# Patient Record
Sex: Female | Born: 1965 | Race: White | Hispanic: No | Marital: Married | Smoking: Never smoker
Health system: Southern US, Community
[De-identification: ages and names within clinical notes are randomized; demographics above are authoritative.]

---

## 1997-12-21 ENCOUNTER — Ambulatory Visit (HOSPITAL_COMMUNITY): Admission: RE | Admit: 1997-12-21 | Discharge: 1997-12-21 | Payer: Self-pay | Admitting: Obstetrics

## 1998-05-05 ENCOUNTER — Inpatient Hospital Stay (HOSPITAL_COMMUNITY): Admission: AD | Admit: 1998-05-05 | Discharge: 1998-05-05 | Payer: Self-pay | Admitting: *Deleted

## 1998-05-06 ENCOUNTER — Inpatient Hospital Stay (HOSPITAL_COMMUNITY): Admission: AD | Admit: 1998-05-06 | Discharge: 1998-05-08 | Payer: Self-pay | Admitting: Obstetrics & Gynecology

## 1998-08-04 ENCOUNTER — Inpatient Hospital Stay (HOSPITAL_COMMUNITY): Admission: AD | Admit: 1998-08-04 | Discharge: 1998-08-04 | Payer: Self-pay | Admitting: Obstetrics & Gynecology

## 2010-03-13 ENCOUNTER — Emergency Department (HOSPITAL_COMMUNITY)
Admission: EM | Admit: 2010-03-13 | Discharge: 2010-03-13 | Payer: Self-pay | Source: Home / Self Care | Admitting: Family Medicine

## 2010-03-20 LAB — POCT URINALYSIS DIPSTICK
Bilirubin Urine: NEGATIVE
Ketones, ur: NEGATIVE mg/dL
Nitrite: POSITIVE — AB
Protein, ur: NEGATIVE mg/dL
Specific Gravity, Urine: 1.025 (ref 1.005–1.030)
Urine Glucose, Fasting: NEGATIVE mg/dL
Urobilinogen, UA: 0.2 mg/dL (ref 0.0–1.0)
pH: 5.5 (ref 5.0–8.0)

## 2010-03-20 LAB — POCT PREGNANCY, URINE: Preg Test, Ur: NEGATIVE

## 2010-03-20 LAB — POCT RAPID STREP A (OFFICE): Streptococcus, Group A Screen (Direct): NEGATIVE

## 2010-04-05 ENCOUNTER — Ambulatory Visit: Admit: 2010-04-05 | Payer: Self-pay | Admitting: Obstetrics and Gynecology

## 2010-04-05 ENCOUNTER — Other Ambulatory Visit: Payer: Self-pay | Admitting: Family Medicine

## 2010-04-05 ENCOUNTER — Encounter: Payer: Self-pay | Admitting: Obstetrics and Gynecology

## 2010-04-05 ENCOUNTER — Encounter: Payer: Self-pay | Admitting: Obstetrics & Gynecology

## 2010-04-05 DIAGNOSIS — E049 Nontoxic goiter, unspecified: Secondary | ICD-10-CM

## 2010-04-05 DIAGNOSIS — N938 Other specified abnormal uterine and vaginal bleeding: Secondary | ICD-10-CM

## 2010-04-05 DIAGNOSIS — E010 Iodine-deficiency related diffuse (endemic) goiter: Secondary | ICD-10-CM

## 2010-04-05 DIAGNOSIS — N949 Unspecified condition associated with female genital organs and menstrual cycle: Secondary | ICD-10-CM

## 2010-04-05 LAB — CONVERTED CEMR LAB: TSH: 1.793 microintl units/mL (ref 0.350–4.500)

## 2010-04-06 ENCOUNTER — Ambulatory Visit (HOSPITAL_COMMUNITY)
Admission: RE | Admit: 2010-04-06 | Discharge: 2010-04-06 | Disposition: A | Discharge status: Home / Self Care | Payer: Self-pay | Source: Ambulatory Visit

## 2010-04-06 ENCOUNTER — Other Ambulatory Visit: Payer: Self-pay | Admitting: Obstetrics & Gynecology

## 2010-04-06 DIAGNOSIS — E010 Iodine-deficiency related diffuse (endemic) goiter: Secondary | ICD-10-CM

## 2010-04-06 DIAGNOSIS — N938 Other specified abnormal uterine and vaginal bleeding: Secondary | ICD-10-CM

## 2010-04-06 DIAGNOSIS — E049 Nontoxic goiter, unspecified: Secondary | ICD-10-CM | POA: Insufficient documentation

## 2010-04-06 LAB — POCT PREGNANCY, URINE: Preg Test, Ur: NEGATIVE

## 2010-04-19 ENCOUNTER — Ambulatory Visit: Payer: Self-pay | Admitting: Family Medicine

## 2010-04-19 ENCOUNTER — Other Ambulatory Visit: Payer: Self-pay | Admitting: Family Medicine

## 2010-04-19 DIAGNOSIS — E049 Nontoxic goiter, unspecified: Secondary | ICD-10-CM

## 2010-04-19 DIAGNOSIS — N926 Irregular menstruation, unspecified: Secondary | ICD-10-CM

## 2010-04-19 DIAGNOSIS — N939 Abnormal uterine and vaginal bleeding, unspecified: Secondary | ICD-10-CM

## 2010-04-19 LAB — POCT PREGNANCY, URINE: Preg Test, Ur: NEGATIVE

## 2010-05-11 ENCOUNTER — Ambulatory Visit: Payer: Self-pay | Admitting: Obstetrics & Gynecology

## 2010-05-17 ENCOUNTER — Ambulatory Visit: Payer: Self-pay | Admitting: Obstetrics & Gynecology

## 2010-05-17 DIAGNOSIS — N920 Excessive and frequent menstruation with regular cycle: Secondary | ICD-10-CM

## 2010-05-18 ENCOUNTER — Encounter: Payer: Self-pay | Admitting: *Deleted

## 2010-05-18 LAB — CONVERTED CEMR LAB
Clue Cells Wet Prep HPF POC: NONE SEEN
Trich, Wet Prep: NONE SEEN
Yeast Wet Prep HPF POC: NONE SEEN

## 2010-05-26 NOTE — Progress Notes (Signed)
NAME:  Marie, Watson ACCOUNT NO.:  [id number]  MEDICAL RECORD NO.:  [id number]           PATIENT TYPE:  A  LOCATION:  [facility]                   FACILITY:  [facility]  PHYSICIAN:  Marie Mellow, DO   DATE OF BIRTH:  01-Aug-1965  DATE OF SERVICE:  04/19/2010                                 CLINIC NOTE  CHIEF COMPLAINT:  She needed to get the result of her blood work and ultrasound that were done previously.  She also states that she has some lower abdominal pain on the left which she thinks is related to her ovary.  HISTORY OF PRESENT ILLNESS:  The patient is a 45 year old female gravida 2, para 2, who had presented to the GYN Clinic previously for irregular menstrual bleeding.  She states that her last regular menstrual period was sometime in September 2011, but since has been having periods that go back and forth between 10 and 14 days apart and she will have heavy bleeding and then no bleeding and then heavy bleeding and no bleeding. This has continued since her past visit on April 05, 2010.  She states that most of the time her bleeding is painless and she does not feel weak or dizzy.  She has no idea about her mom or her sister's history of their menopausal history are any difficulties they had.  She does note two of her sisters have a history of thyroid disease for which they take medication. When she was seen back on April 05, 2010, she was noted to have enlargement of her thyroid, so she was scheduled to have an ultrasound of her thyroid and of her uterus for further evaluation.  SURGICAL HISTORY:  History of 1 C-section.  GYNECOLOGICAL HISTORY:  No history of abnormal paps.  No history of STD.  OBSTETRICAL HISTORY:  Gravida 2, para 2, history of 1 C-section and 1 vaginal delivery after C-section.  PHYSICAL EXAMINATION:  VITAL SIGNS:  The patient's temperature is 98.0, pulse 91, blood pressure 155/86, height is 63 inches.  Ultrasound findings of the  uterus showed, endometrium appears trilayered with an 11-mm area of focal echogenicity is seen in the right fundal region, measuring 1.5 x 1.1 x 1.4 cm.  No definite feeding vessels seen at this region, but it appears to be suspicious for a focal polyp.  The left ovary measures 4.2 x 2.7 x 2.3 and contains a unilocular simple cyst measuring 3.3 x 1.1 x 2.5 cm.  Right ovary has normal appearance. Thyroid ultrasound shows markedly inhomogeneous thyroid parenchyma with a dominant 5 x 24 mm hypoechoic nodule on the left, this is where the enlargement was palpated on April 05, 2010.  ASSESSMENT: 1. Dysfunctional uterine bleeding with fundal polyp.  The patient     needs to have D and C and hysteroscopy, however, because of the     thyroid problem, it is recommended that she is seen for her thyroid     problem first to make sure that she is not at risk for thyroid     storm during the operation. 2. 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  She has     consulted [facility] on April 20, 2010, at which time it  will be determined if she needs to have any biopsies or not.  Of     note, her thyroid function was normal at 1.793 and requires no     medications at this time.  However, they may do further studies at     ENT that would require such.  These results were shared with the     patient through the use of an interpreter because she does speak     [place], she is from [place], but she voices understanding.  All of her     and her husband's questions were answered and she is to follow up     in the clinic after she sees ENT to schedule for her D and C and     hysteroscopy.          ______________________________ Marie Mellow, DO    SH/MEDQ  D:  04/21/2010  T:  04/21/2010  Job:  [id number]

## 2010-05-26 NOTE — Progress Notes (Signed)
Marie Watson, Marie Watson ACCOUNT NO.:  [id number]  MEDICAL RECORD NO.:  [id number]           PATIENT TYPE:  A  [facility]ATION[facility]                   [facility]ACILITY[facility]  PHYSICIAN:  Lucina Mellow, DO   DATE OF BIRTH:  [birth year]  DATE OF SERVICE:  04/05/2010                                 CLINIC NOTE  The patient is a 45 year old female, gravida 2, para 2 who presents to the GYN Clinic this afternoon for a chief complaint of irregular menstrual bleeding.  The patient states that her last regular menstrual period was in September 2011, but since then she has been having these periods the time they go back and forth about 10-14 days apart, where she will have heavy bleeding and then no bleeding and then heavy bleeding and then no bleeding.  However, since March 02, 2010, she has no bleeding every day at a rate of about a medium flow, where she is changing her pad about every 3 hours.  The patient states that she has no pain with this bleeding and does not feel weak or dizzy.  She does not know how old her mom was when she went through menopause.  She has 6 sisters, but she does not know when any of them went through menopause. Two of her sisters have a history of thyroid disease.  Her Pap was last done in July 2011.  She has never had an abnormal Pap test and denies a history of any sexually transmitted diseases.  SURGICAL HISTORY:  History of one cesarean section.  GYNECOLOGIC HISTORY:  As noted, no history of abnormal Paps, no history of STD.  OBSTETRICAL HISTORY:  She is a gravida 2, para 2-0-0-2, history of one C- section and one successful vaginal delivery after cesarean section.  MEDICAL HISTORY:  None.  PHYSICAL EXAMINATION:  Today, the patient is with a blood pressure of 133/85, temperature of 97.0, pulse of 93, weight of 226.8, height of 63 inches.  She in general is a pleasant-appearing female who looks younger than her stated age of 43.  Her nationality  is [place] and she speaks [place].  Her heart is regular rate and rhythm with no audible murmurs or gallops.  Her lungs are clear to auscultation bilaterally.  Her thyroid feels enlarged on the left side without any obvious nodule.  Her external female genitalia are normal in appearance without any scarring or abnormalities.  Internally, her vagina is pink with appropriate number of rugae.  Her cervix is visualized with moderate amount of bleeding noted coming from the cervical os with a moderate amount of bright red blood in the vaginal vault without any clots.  Bimanual exam reveals about a 15-week size uterus, normal feeling adnexa without any pain or cervical motion tenderness.  ASSESSMENT: 1. Dysfunctional uterine bleeding.  The patient will have an     ultrasound scheduled to evaluate her uterus and ovaries.  We will     check her thyroid to make sure this is not a hormonal reason for     her bleeding dysfunction.  A urine pregnancy test also should be     obtained as well as a CBC in the clinic.  She will come back in  approximately a week to two weeks after her ultrasound and discuss     the results and plans. 2. Thyroid enlargement.  We will get a thyroid ultrasound and a TSH to     further evaluate this and treat as appropriate.  I was discussed     with the patient briefly that birth control by mouth maybe one of     the options to control the bleeding.  However, this is not started     today without reason behind it, the fact that she is having the     bleeding in addition to the fact that she is 44 years old.  We will     discuss treatment options which she follows up after her testing at     which time an endometrial biopsy may be obtained.  The patient     voices understanding and agrees with the plan.          ______________________________ Lucina Mellow, DO    SH/MEDQ  D:  04/05/2010  T:  04/06/2010  Job:  [id number]

## 2010-05-26 NOTE — H&P (Signed)
NAME:  Marie Watson, Marie Watson NO.:  [id number]  MEDICAL RECORD NO.:  [id number]           PATIENT TYPE:  A  [facility]ATION[facility]                   [facility]  PHYSICIAN:  Scheryl Darter, MD       DATE OF BIRTH:  01-24-1966  DATE OF SERVICE:                          PRE-OP HISTORY & PHYSICAL  CHIEF COMPLAINT:  Heavy irregular menstrual bleeding.  The patient is a 45 year old female from [place] gravida 2, para 2 who came to GYN Clinic due to irregular menstrual bleeding.  She had regular periods until September and since then, her periods that have been between 10 and 14 days apart and showed a heavy bleeding, then no bleeding, then heavy bleeding, then no bleeding.  Last menstrual period was April 21, 2010.  She does feel weak and dizzy, apnea, dysmenorrhea.  PAST MEDICAL HISTORY:  Thyroid enlargement with normal thyroid function.  PAST SURGICAL HISTORY:  Cesarean section.  GYNECOLOGIC HISTORY:  History of abnormal Pap.  SOCIAL HISTORY:  The patient is married.  She is from [place].  She requires an interpreter.  She speaks some [place].  No alcohol, tobacco, or drug use.  MEDICATIONS:  None.  No known drug allergies.  No latex allergy.  FAMILY HISTORY:  Diabetes and hypertension in her parents.  REVIEW OF SYSTEMS:  No bleeding today.  She notes a discharge with no odor or itching.  No pain.  No dysuria.  PHYSICAL EXAMINATION:  GENERAL:  In no acute distress.  The patient's affect appears normal. VITAL SIGNS:  Weight is 229 pounds, height 63 inches, blood pressure 118/77, pulse 87, temperature 98.4. ABDOMEN:  Soft, nontender.  No mass. EXTERNAL GENITALIA:  Vagina, cervix showed slight thin mucus discharge. Cervix appears normal.  No blood.  Uterus is normal size, nontender.  No mass.  Ultrasound done on April 06, 2010, showed uterus measuring 9.7 x 4.8 x 6 cm with mildly heterogeneous myometrium.  Endometrium was 11 mm with a 1.5 x 1.1 x 1.4 cm  fundal focal echogenicity suspicious for focal polyp. Ovaries appeared normal.  IMPRESSION:  Dysfunctional uterine bleeding and probable endometrial polyp.  PLAN:  She will be scheduled for hysteroscopy, dilation and curettage. We will perform polypectomy if indicated.  Procedure was explained, and the risks of anesthesia, bleeding, infection, uterine damage. Interpreter was present for the discussion as was her husband. Questions were answered.  This will be scheduled as an outpatient.     Scheryl Darter, MD    JA/MEDQ  D:  05/17/2010  T:  05/17/2010  Job:  [id number]

## 2010-05-29 ENCOUNTER — Other Ambulatory Visit: Payer: Self-pay | Admitting: Obstetrics & Gynecology

## 2010-05-29 ENCOUNTER — Ambulatory Visit (HOSPITAL_COMMUNITY)
Admission: RE | Admit: 2010-05-29 | Discharge: 2010-05-29 | Disposition: A | Discharge status: Home / Self Care | Payer: Self-pay | Source: Ambulatory Visit | Attending: Obstetrics & Gynecology | Admitting: Obstetrics & Gynecology

## 2010-05-29 DIAGNOSIS — N840 Polyp of corpus uteri: Secondary | ICD-10-CM

## 2010-05-29 DIAGNOSIS — N949 Unspecified condition associated with female genital organs and menstrual cycle: Secondary | ICD-10-CM | POA: Insufficient documentation

## 2010-05-29 DIAGNOSIS — N938 Other specified abnormal uterine and vaginal bleeding: Secondary | ICD-10-CM | POA: Insufficient documentation

## 2010-05-29 LAB — CBC
HCT: 29.4 % — ABNORMAL LOW (ref 36.0–46.0)
Hemoglobin: 8.8 g/dL — ABNORMAL LOW (ref 12.0–15.0)
MCH: 21.3 pg — ABNORMAL LOW (ref 26.0–34.0)
MCHC: 29.9 g/dL — ABNORMAL LOW (ref 30.0–36.0)
MCV: 71 fL — ABNORMAL LOW (ref 78.0–100.0)
Platelets: 361 10*3/uL (ref 150–400)
RBC: 4.14 MIL/uL (ref 3.87–5.11)
RDW: 15.5 % (ref 11.5–15.5)
WBC: 8 10*3/uL (ref 4.0–10.5)

## 2010-05-29 LAB — PREGNANCY, URINE: Preg Test, Ur: NEGATIVE

## 2010-06-05 NOTE — Op Note (Signed)
  NAME:  Marie Watson, Marie Watson NO.:  [id number]  MEDICAL RECORD NO.:  [id number]           PATIENT TYPE:  O  LOCATION:  [facility]                          [facility]  PHYSICIAN:  Scheryl Darter, MD       DATE OF BIRTH:  Feb 18, 1966  DATE OF PROCEDURE:  05/29/2010 DATE OF DISCHARGE:                              OPERATIVE REPORT   PREOPERATIVE DIAGNOSIS:  Dysfunctional uterine bleeding and endometrial polyp.  POSTOPERATIVE DIAGNOSIS:  Dysfunctional uterine bleeding.  PROCEDURE:  Hysteroscopy and endometrial curettage.  SURGEON:  Scheryl Darter, MD  ANESTHESIA:  General plus local.  SPECIMENS:  Endometrial curettings.  ESTIMATED BLOOD LOSS:  Minimal.  COMPLICATIONS:  None.  DRAINS:  None.  COUNTS:  Correct.  OPERATIVE COURSE:  The patient gave written consent for hysteroscopy, dilation and curettage, and possible endometrial polypectomy.  She had been experiencing dysfunctional uterine bleeding and ultrasound showed a possible 1.5-cm endometrial polyp.  The patient identification was confirmed.  She was brought to the OR and general anesthesia was induced.  She was placed in dorsal lithotomy position.  Perineum and vagina were sterilely prepped and draped.  Exam revealed normal-size uterus.  No adnexal masses.  Bladder was drained with a red rubber catheter.  Speculum was inserted and cervix was grasped with single- tooth tenaculum.  A 0.5% Marcaine 1:200,000 epinephrine was infiltrated. Uterus sounded to 10.5 cm.  Cervix was dilated sufficiently to pass the diagnostic hysteroscope with lactated Ringer's used.  Video camera was induced.  Panoramic view of the endometrial cavity was seen.  Both tubal ostia were seen.  There were no discrete lesions as no discrete polyp was visualized proceeded with curettage.  The hysteroscope was removed. Curette was used to obtain specimens from all quadrants and fair amount of tissue was obtained and appeared in some portions  to be polypoid.  Specimen was sent to Pathology.  There was minimal bleeding. All instruments were then removed.  The patient tolerated the procedure well without complication.  She was brought in stable condition to the recovery room.     Scheryl Darter, MD     JA/MEDQ  D:  05/29/2010  T:  05/30/2010  Job:  [id number]  Electronically Signed by Scheryl Darter MD on 06/05/2010 11:36:21 AM

## 2010-06-21 ENCOUNTER — Ambulatory Visit: Payer: Self-pay | Admitting: Obstetrics & Gynecology

## 2010-06-21 DIAGNOSIS — N840 Polyp of corpus uteri: Secondary | ICD-10-CM

## 2010-06-21 DIAGNOSIS — Z09 Encounter for follow-up examination after completed treatment for conditions other than malignant neoplasm: Secondary | ICD-10-CM

## 2010-06-22 NOTE — Group Therapy Note (Signed)
NAME:  Marie Watson, Marie Watson NO.:  [id number]  MEDICAL RECORD NO.:  [id number]           PATIENT TYPE:  A  [facility]                   [facility]  PHYSICIAN:  Scheryl Darter, MD       DATE OF BIRTH:  Jun 16, 1965  DATE OF SERVICE:  06/21/2010                                 CLINIC NOTE  The patient returns today after hysteroscopy and D and C performed on May 29, 2010.  The patient had dysfunctional uterine bleeding and endometrial polyp on ultrasound.  Hysteroscopy showed some polyps. Endometrial curettings were done.  This showed proliferative endometrium and fragments of benign endometrial polyp.  The patient has no bleeding now.  She bled for about 10 days after surgery.  No complaints of pain, fever, or abnormal discharge.  Abdomen is soft and nontender.  IMPRESSION:  The patient is doing well after surgery.  She should keep a menstrual calendar.  She should notify if she has problems in the meantime.     Scheryl Darter, MD    JA/MEDQ  D:  06/21/2010  T:  06/22/2010  Job:  [id number]

## 2010-06-30 ENCOUNTER — Inpatient Hospital Stay (HOSPITAL_COMMUNITY)
Admission: AD | Admit: 2010-06-30 | Discharge: 2010-06-30 | Disposition: A | Discharge status: Home / Self Care | Payer: Self-pay | Source: Ambulatory Visit | Attending: Family Medicine | Admitting: Family Medicine

## 2010-06-30 DIAGNOSIS — N949 Unspecified condition associated with female genital organs and menstrual cycle: Secondary | ICD-10-CM | POA: Insufficient documentation

## 2010-06-30 DIAGNOSIS — N760 Acute vaginitis: Secondary | ICD-10-CM | POA: Insufficient documentation

## 2010-06-30 DIAGNOSIS — B9689 Other specified bacterial agents as the cause of diseases classified elsewhere: Secondary | ICD-10-CM | POA: Insufficient documentation

## 2010-06-30 DIAGNOSIS — A499 Bacterial infection, unspecified: Secondary | ICD-10-CM | POA: Insufficient documentation

## 2010-06-30 LAB — URINALYSIS, ROUTINE W REFLEX MICROSCOPIC
Bilirubin Urine: NEGATIVE
Glucose, UA: NEGATIVE mg/dL
Hgb urine dipstick: NEGATIVE
Ketones, ur: NEGATIVE mg/dL
Nitrite: NEGATIVE
Protein, ur: NEGATIVE mg/dL
Specific Gravity, Urine: >1.03 — ABNORMAL HIGH (ref 1.005–1.030)
Urobilinogen, UA: 0.2 mg/dL (ref 0.0–1.0)
pH: 5.5 (ref 5.0–8.0)

## 2010-06-30 LAB — WET PREP, GENITAL
Clue Cells Wet Prep HPF POC: NONE SEEN
Trich, Wet Prep: NONE SEEN
Yeast Wet Prep HPF POC: NONE SEEN

## 2010-07-28 ENCOUNTER — Inpatient Hospital Stay (HOSPITAL_COMMUNITY)
Admission: AD | Admit: 2010-07-28 | Discharge: 2010-07-28 | Disposition: A | Discharge status: Home / Self Care | Payer: Self-pay | Source: Ambulatory Visit | Attending: Obstetrics & Gynecology | Admitting: Obstetrics & Gynecology

## 2010-07-28 DIAGNOSIS — N920 Excessive and frequent menstruation with regular cycle: Secondary | ICD-10-CM

## 2010-07-28 LAB — CBC
HCT: 34.7 % — ABNORMAL LOW (ref 36.0–46.0)
Hemoglobin: 11 g/dL — ABNORMAL LOW (ref 12.0–15.0)
MCH: 24.1 pg — ABNORMAL LOW (ref 26.0–34.0)
MCHC: 31.7 g/dL (ref 30.0–36.0)
MCV: 75.9 fL — ABNORMAL LOW (ref 78.0–100.0)
Platelets: 342 10*3/uL (ref 150–400)
RBC: 4.57 MIL/uL (ref 3.87–5.11)
RDW: 21.4 % — ABNORMAL HIGH (ref 11.5–15.5)
WBC: 6.7 10*3/uL (ref 4.0–10.5)

## 2010-07-28 LAB — POCT PREGNANCY, URINE: Preg Test, Ur: NEGATIVE

## 2010-09-01 ENCOUNTER — Ambulatory Visit: Payer: Self-pay | Admitting: Obstetrics & Gynecology

## 2010-09-01 DIAGNOSIS — N920 Excessive and frequent menstruation with regular cycle: Secondary | ICD-10-CM

## 2010-09-02 NOTE — Group Therapy Note (Signed)
NAME:  Marie Watson, Marie Watson NO.:  [id number]  MEDICAL RECORD NO.:  [id number]           PATIENT TYPE:  A  [facility]                   [facility]  PHYSICIAN:  Scheryl Darter, MD       DATE OF BIRTH:  10-29-1965  DATE OF SERVICE:  09/01/2010                                 CLINIC NOTE  The patient returns today due to recent episode of prolonged heavy menstrual bleeding.  The patient is a 45 year old, gravida 2, para 2 who has a history of irregular menstrual periods.  She had endometrial polyps and a hysteroscopy and D and C performed May 29, 2010, which showed polyps and showed proliferative endometrium and endometrial polyp.  She started a menstrual period at mid May and continued until mid June.  She had one visit to [facility] on Jul 28, 2010, due to heavy bleeding.  At that time, her hemoglobin was 11.1.  She was given Provera 10 mg tablets for 7 days.  She currently has no medications.  No drug allergies.  REVIEW OF SYSTEMS:  No bleeding or pain.  PHYSICAL EXAMINATION:  She is not pale.  Her abdomen is nontender.  I deferred a pelvic exam today.  We discussed her dysfunctional uterine bleeding.  I offered hormonal therapy with oral contraceptives or Mirena.  She would like to try oral contraceptives.  She has taken these in the past.  She has no contraindications to their use.  I gave her prescription for Tri-Sprintec.  She should start this as soon as possible.  She can return in 3 months to review her progress.     Scheryl Darter, MD    JA/MEDQ  D:  09/01/2010  T:  09/02/2010  Job:  [id number]

## 2010-12-25 ENCOUNTER — Other Ambulatory Visit: Payer: Self-pay | Admitting: Otolaryngology

## 2011-01-12 ENCOUNTER — Ambulatory Visit: Payer: Self-pay | Admitting: Obstetrics & Gynecology

## 2013-05-05 ENCOUNTER — Other Ambulatory Visit: Payer: Self-pay | Admitting: *Deleted

## 2013-05-05 DIAGNOSIS — I83893 Varicose veins of bilateral lower extremities with other complications: Secondary | ICD-10-CM

## 2013-06-10 ENCOUNTER — Other Ambulatory Visit: Payer: Self-pay | Admitting: Obstetrics and Gynecology

## 2013-06-10 ENCOUNTER — Other Ambulatory Visit (HOSPITAL_COMMUNITY)
Admission: RE | Admit: 2013-06-10 | Discharge: 2013-06-10 | Disposition: A | Discharge status: Home / Self Care | Payer: BC Managed Care – PPO | Source: Ambulatory Visit | Attending: Obstetrics and Gynecology | Admitting: Obstetrics and Gynecology

## 2013-06-10 DIAGNOSIS — Z1151 Encounter for screening for human papillomavirus (HPV): Secondary | ICD-10-CM | POA: Insufficient documentation

## 2013-06-10 DIAGNOSIS — Z01419 Encounter for gynecological examination (general) (routine) without abnormal findings: Secondary | ICD-10-CM | POA: Insufficient documentation

## 2013-06-17 ENCOUNTER — Encounter: Payer: Self-pay | Admitting: Vascular Surgery

## 2013-06-18 ENCOUNTER — Encounter: Payer: Self-pay | Admitting: Vascular Surgery

## 2013-06-18 ENCOUNTER — Ambulatory Visit (INDEPENDENT_AMBULATORY_CARE_PROVIDER_SITE_OTHER): Payer: BC Managed Care – PPO | Admitting: Vascular Surgery

## 2013-06-18 ENCOUNTER — Ambulatory Visit (HOSPITAL_COMMUNITY)
Admission: RE | Admit: 2013-06-18 | Discharge: 2013-06-18 | Disposition: A | Discharge status: Home / Self Care | Payer: BC Managed Care – PPO | Source: Ambulatory Visit | Attending: Vascular Surgery | Admitting: Vascular Surgery

## 2013-06-18 VITALS — BP 121/71 | HR 92 | Ht 66.0 in | Wt 224.0 lb

## 2013-06-18 DIAGNOSIS — I83893 Varicose veins of bilateral lower extremities with other complications: Secondary | ICD-10-CM

## 2013-06-18 DIAGNOSIS — I8390 Asymptomatic varicose veins of unspecified lower extremity: Secondary | ICD-10-CM | POA: Insufficient documentation

## 2013-06-18 NOTE — Progress Notes (Signed)
[facility] [place] HISTORY AND PHYSICAL   History of Present Illness:  Patient is a 48 y.o. year old female who presents for evaluation of varicose veins. The patient has developed scattered spider-type veins on her anterior thigh and medial calf. They have been present for several months. He did get slightly worse after having 2 childbirth sessions. She denies prior history of DVT. She does have a family history of varicose veins in her mother and father. She has occasional leg swelling.   No past medical history on file.  No past surgical history on file.  Social History History  Substance Use Topics  . Smoking status: Never Smoker   . Smokeless tobacco: Never Used  . Alcohol Use: No    Family History Family History  Problem Relation Age of Onset  . Diabetes Mother   . Varicose Veins Father   . Diabetes Brother     Allergies  No Known Allergies   No current outpatient prescriptions on file.   No current facility-administered medications for this visit.    ROS:   General:  No weight loss, Fever, chills  HEENT: No recent headaches, no nasal bleeding, no visual changes, no sore throat  Neurologic: No dizziness, blackouts, seizures. No recent symptoms of stroke or mini- stroke. No recent episodes of slurred speech, or temporary blindness.  Cardiac: No recent episodes of chest pain/pressure, no shortness of breath at rest.  No shortness of breath with exertion.  Denies history of atrial fibrillation or irregular heartbeat  Vascular: No history of rest pain in feet.  No history of claudication.  No history of non-healing ulcer, No history of DVT   Pulmonary: No home oxygen, no productive cough, no hemoptysis,  No asthma or wheezing  Musculoskeletal:  [ ]  Arthritis, [ ]  Low back pain,  [ ]  Joint pain  Hematologic:No history of hypercoagulable state.  No history of easy bleeding.  No history of anemia  Gastrointestinal: No hematochezia or melena,   No gastroesophageal reflux, no trouble swallowing  Urinary: [ ]  chronic Kidney disease, [ ]  on HD - [ ]  MWF or [ ]  TTHS, [ ]  Burning with urination, [ ]  Frequent urination, [ ]  Difficulty urinating;   Skin: No rashes  Psychological: No history of anxiety,  No history of depression   Physical Examination  Filed Vitals:   06/18/13 1338  BP: 121/71  Pulse: 92  Height: 5\' 6"  (1.676 m)  Weight: 224 lb (101.606 kg)  SpO2: 100%    Body mass index is 36.17 kg/(m^2).  General:  Alert and oriented, no acute distress HEENT: Normal Neck: No bruit or JVD Pulmonary: Clear to auscultation bilaterally Cardiac: Regular Rate and Rhythm without murmur Abdomen: Soft, non-tender, non-distended, no mass, obese Skin: No rash, scattered spider varicosities right lateral thigh medial knee and thigh left leg Extremity Pulses:  2+ radial, brachial, femoral, dorsalis pedis, posterior tibial pulses bilaterally Musculoskeletal: No deformity or edema  Neurologic: Upper and lower extremity motor 5/5 and symmetric  DATA: Patient had a venous duplex exam today. 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 She was also advised and weight loss. She will set up an appointment in the near future with her vein nurse for sclerotherapy injection.  Fabienne Brunsharles Alyze Lauf, MD [facility] [place] Office: [phone] Pager: [phone]

## 2014-04-02 ENCOUNTER — Other Ambulatory Visit: Payer: Self-pay | Admitting: Family Medicine

## 2014-04-02 ENCOUNTER — Ambulatory Visit
Admission: RE | Admit: 2014-04-02 | Discharge: 2014-04-02 | Disposition: A | Discharge status: Home / Self Care | Payer: BLUE CROSS/BLUE SHIELD | Source: Ambulatory Visit | Attending: Family Medicine | Admitting: Family Medicine

## 2014-04-02 DIAGNOSIS — E010 Iodine-deficiency related diffuse (endemic) goiter: Secondary | ICD-10-CM

## 2014-04-02 DIAGNOSIS — M542 Cervicalgia: Secondary | ICD-10-CM

## 2014-04-06 ENCOUNTER — Other Ambulatory Visit: Payer: Self-pay

## 2014-04-08 ENCOUNTER — Ambulatory Visit
Admission: RE | Admit: 2014-04-08 | Discharge: 2014-04-08 | Disposition: A | Discharge status: Home / Self Care | Payer: BLUE CROSS/BLUE SHIELD | Source: Ambulatory Visit | Attending: Family Medicine | Admitting: Family Medicine

## 2014-04-08 DIAGNOSIS — E010 Iodine-deficiency related diffuse (endemic) goiter: Secondary | ICD-10-CM

## 2014-06-17 ENCOUNTER — Other Ambulatory Visit: Payer: Self-pay | Admitting: Obstetrics and Gynecology

## 2014-06-17 DIAGNOSIS — Z1231 Encounter for screening mammogram for malignant neoplasm of breast: Secondary | ICD-10-CM

## 2014-06-22 ENCOUNTER — Ambulatory Visit
Admission: RE | Admit: 2014-06-22 | Discharge: 2014-06-22 | Disposition: A | Discharge status: Home / Self Care | Payer: BLUE CROSS/BLUE SHIELD | Source: Ambulatory Visit | Attending: Obstetrics and Gynecology | Admitting: Obstetrics and Gynecology

## 2014-06-22 DIAGNOSIS — Z1231 Encounter for screening mammogram for malignant neoplasm of breast: Secondary | ICD-10-CM

## 2016-12-25 ENCOUNTER — Other Ambulatory Visit: Payer: Self-pay | Admitting: Obstetrics and Gynecology

## 2016-12-25 DIAGNOSIS — Z1231 Encounter for screening mammogram for malignant neoplasm of breast: Secondary | ICD-10-CM

## 2016-12-27 ENCOUNTER — Ambulatory Visit
Admission: RE | Admit: 2016-12-27 | Discharge: 2016-12-27 | Disposition: A | Discharge status: Home / Self Care | Payer: BLUE CROSS/BLUE SHIELD | Source: Ambulatory Visit | Attending: Obstetrics and Gynecology | Admitting: Obstetrics and Gynecology

## 2016-12-27 DIAGNOSIS — Z1231 Encounter for screening mammogram for malignant neoplasm of breast: Secondary | ICD-10-CM

## 2017-12-09 ENCOUNTER — Other Ambulatory Visit: Payer: Self-pay | Admitting: Obstetrics and Gynecology

## 2017-12-09 DIAGNOSIS — Z1231 Encounter for screening mammogram for malignant neoplasm of breast: Secondary | ICD-10-CM

## 2018-01-07 ENCOUNTER — Ambulatory Visit
Admission: RE | Admit: 2018-01-07 | Discharge: 2018-01-07 | Disposition: A | Discharge status: Home / Self Care | Payer: BLUE CROSS/BLUE SHIELD | Source: Ambulatory Visit | Attending: Obstetrics and Gynecology | Admitting: Obstetrics and Gynecology

## 2018-01-07 DIAGNOSIS — Z1231 Encounter for screening mammogram for malignant neoplasm of breast: Secondary | ICD-10-CM

## 2018-01-09 ENCOUNTER — Other Ambulatory Visit: Payer: Self-pay | Admitting: Obstetrics and Gynecology

## 2018-01-09 DIAGNOSIS — R928 Other abnormal and inconclusive findings on diagnostic imaging of breast: Secondary | ICD-10-CM

## 2018-03-11 ENCOUNTER — Other Ambulatory Visit: Payer: Self-pay | Admitting: Obstetrics and Gynecology

## 2018-03-11 ENCOUNTER — Other Ambulatory Visit (HOSPITAL_COMMUNITY)
Admission: RE | Admit: 2018-03-11 | Discharge: 2018-03-11 | Disposition: A | Discharge status: Home / Self Care | Payer: BLUE CROSS/BLUE SHIELD | Source: Ambulatory Visit | Attending: Obstetrics and Gynecology | Admitting: Obstetrics and Gynecology

## 2018-03-11 DIAGNOSIS — Z01411 Encounter for gynecological examination (general) (routine) with abnormal findings: Secondary | ICD-10-CM | POA: Diagnosis present

## 2018-03-13 ENCOUNTER — Other Ambulatory Visit: Payer: Self-pay | Admitting: Obstetrics and Gynecology

## 2018-03-13 ENCOUNTER — Ambulatory Visit
Admission: RE | Admit: 2018-03-13 | Discharge: 2018-03-13 | Disposition: A | Discharge status: Home / Self Care | Payer: PRIVATE HEALTH INSURANCE | Source: Ambulatory Visit | Attending: Obstetrics and Gynecology | Admitting: Obstetrics and Gynecology

## 2018-03-13 ENCOUNTER — Ambulatory Visit
Admission: RE | Admit: 2018-03-13 | Discharge: 2018-03-13 | Disposition: A | Discharge status: Home / Self Care | Payer: BLUE CROSS/BLUE SHIELD | Source: Ambulatory Visit | Attending: Obstetrics and Gynecology | Admitting: Obstetrics and Gynecology

## 2018-03-13 DIAGNOSIS — N6310 Unspecified lump in the right breast, unspecified quadrant: Secondary | ICD-10-CM

## 2018-03-13 DIAGNOSIS — R928 Other abnormal and inconclusive findings on diagnostic imaging of breast: Secondary | ICD-10-CM

## 2018-03-13 LAB — CYTOLOGY - PAP
DIAGNOSIS: NEGATIVE
HPV (WINDOPATH): NOT DETECTED

## 2018-03-17 ENCOUNTER — Ambulatory Visit
Admission: RE | Admit: 2018-03-17 | Discharge: 2018-03-17 | Disposition: A | Discharge status: Home / Self Care | Payer: BLUE CROSS/BLUE SHIELD | Source: Ambulatory Visit | Attending: Obstetrics and Gynecology | Admitting: Obstetrics and Gynecology

## 2018-03-17 DIAGNOSIS — N6310 Unspecified lump in the right breast, unspecified quadrant: Secondary | ICD-10-CM

## 2020-08-07 IMAGING — US ULTRASOUND RIGHT BREAST LIMITED
1 series · 10 of 10 positions shown · non-contrast
Comparison: Previous exam(s).

CLINICAL DATA: Screening recall for right breast mass. Strong
family history of breast cancer, with the patient's sister diagnosed
with breast cancer at approximately age 27.

EXAM:
DIGITAL DIAGNOSTIC UNILATERAL RIGHT MAMMOGRAM WITH CAD AND TOMO
RIGHT BREAST ULTRASOUND

[Series 1: ultrasound right breast limited · 0.05mm/px · 10 of 10 slices shown]
[im 1/10]
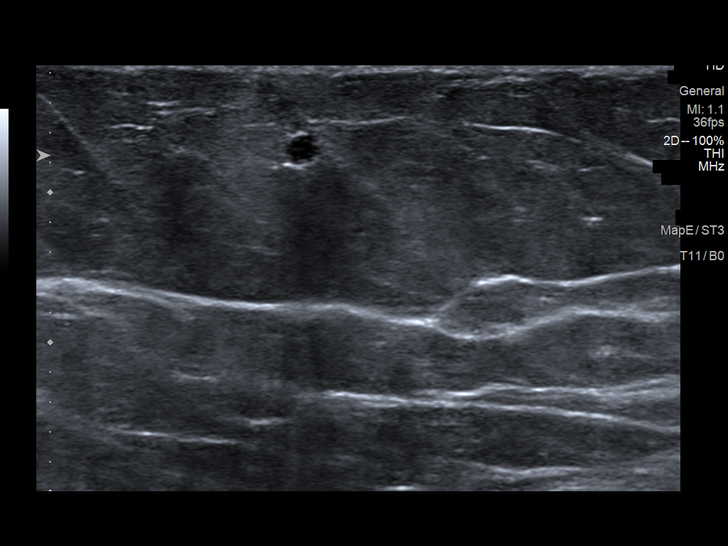
[im 2/10]
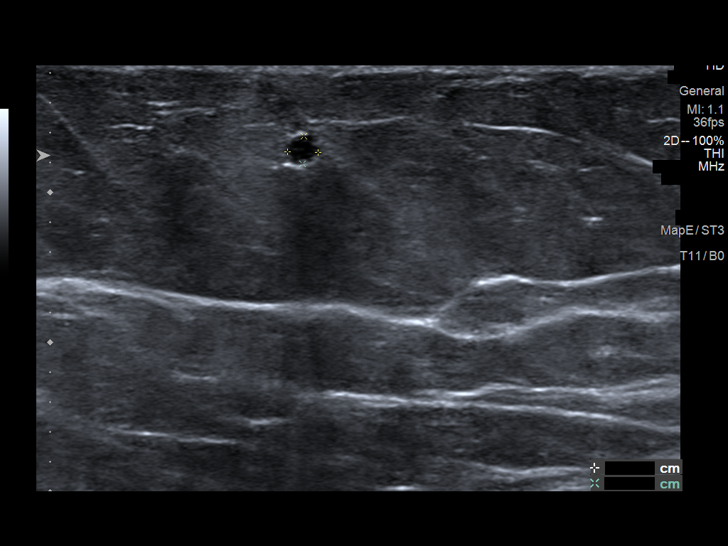
[im 3/10]
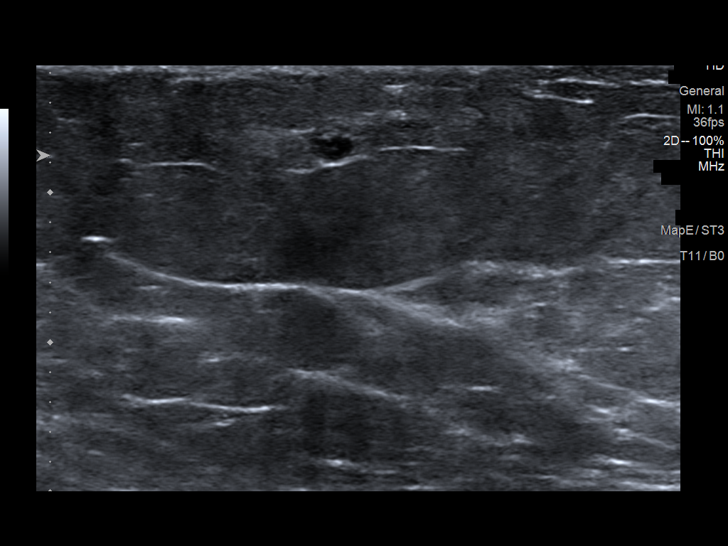
[im 4/10]
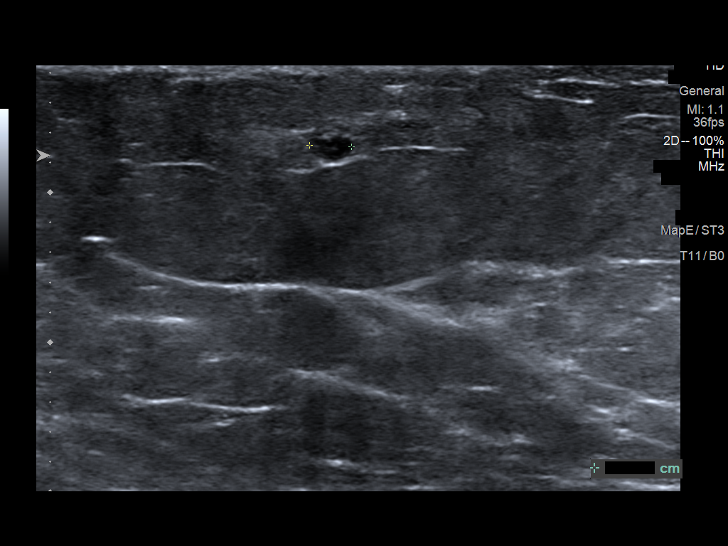
[im 5/10]
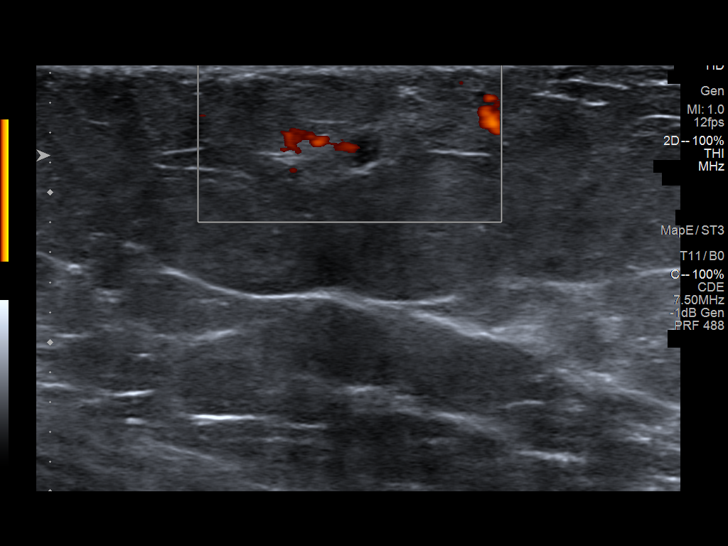
[im 6/10]
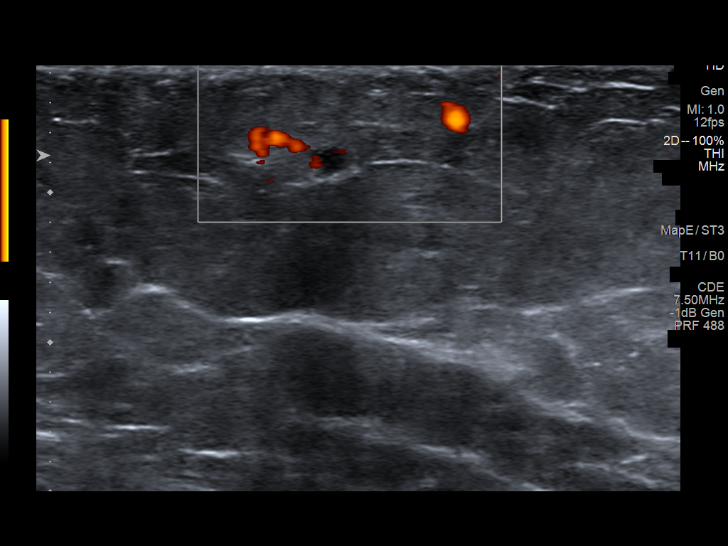
[im 7/10]
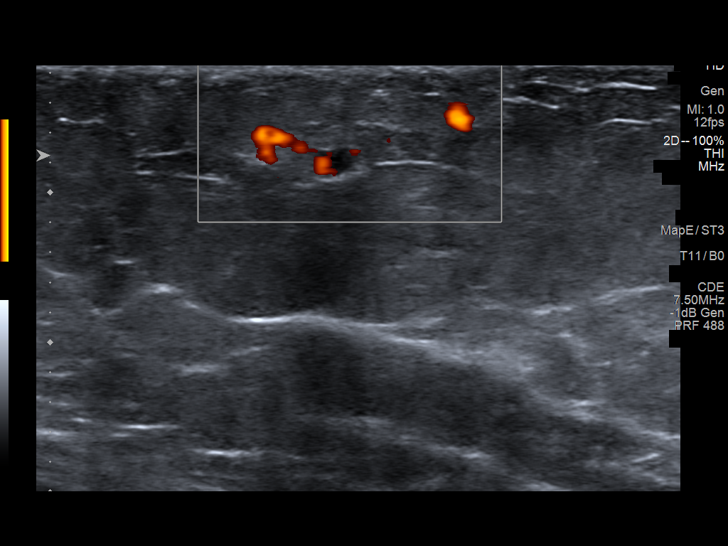
[im 8/10]
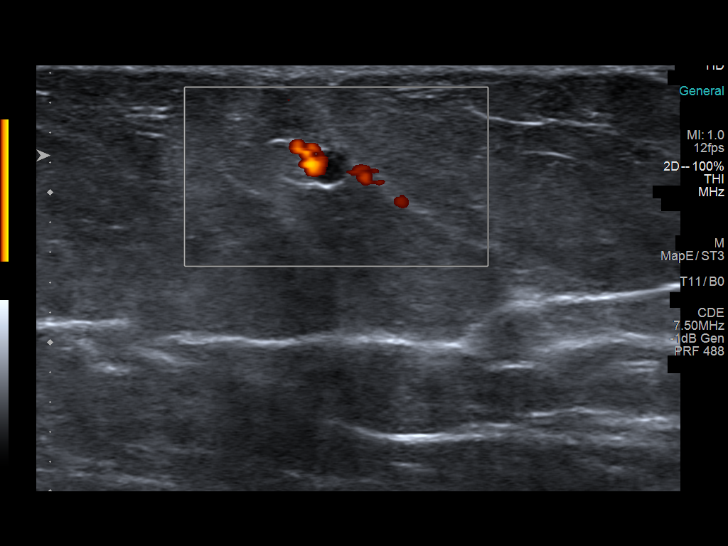
[im 9/10]
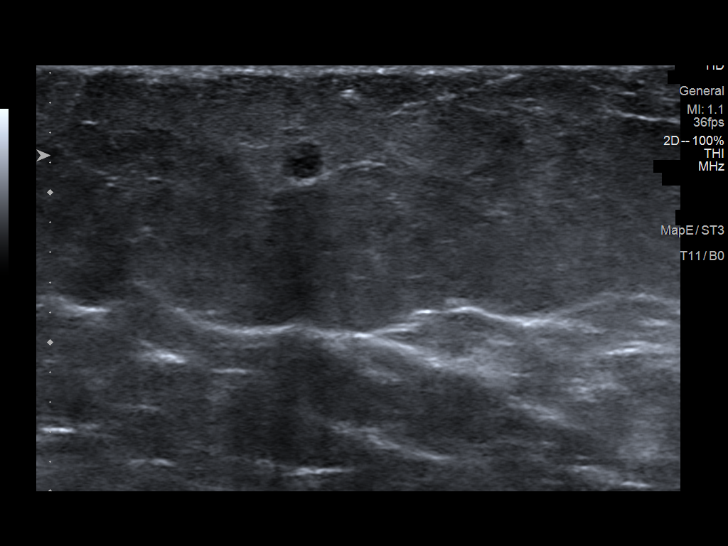
[im 10/10]
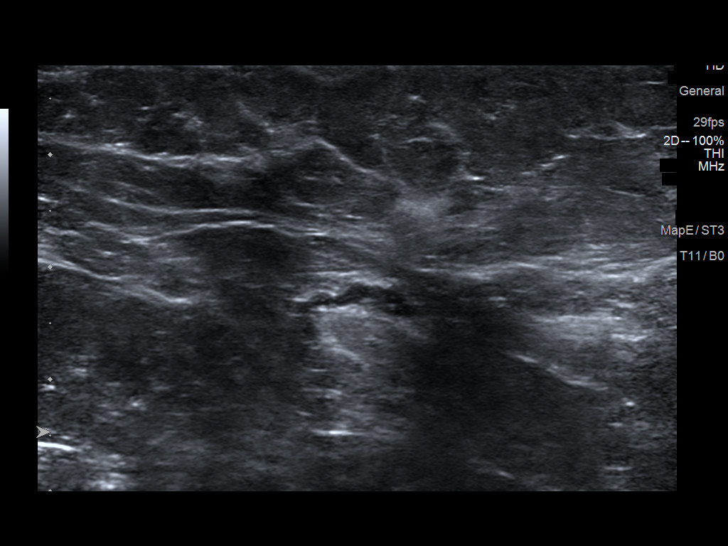

[10 of 10 positions shown; findings below may reference images not displayed]

ACR Breast Density Category b: There are scattered areas of
fibroglandular density.
FINDINGS: Cc and MLO tomograms were performed of the right breast. There is a
small oval well-circumscribed mass in the far superior central right
breast measuring approximately 0.3 cm.

Mammographic images were processed with CAD.

Targeted ultrasound of the right breast was performed. There is an
oval circumscribed hypoechoic mass in the right breast at the [DATE]
position 13 cm from nipple measuring 0.3 x 0.2 x 0.2 cm. This
corresponds well with the mass seen in the right breast at
mammography. This may represent a small reactive lymph node, however
could not be clearly characterized as such.
IMPRESSION: Indeterminate right breast mass.

RECOMMENDATION:
Ultrasound-guided biopsy of the small mass in the right breast is
recommended. This will be scheduled for the patient. The findings
and recommendations have been discussed with both the patient and
her husband.

I have discussed the findings and recommendations with the patient.
Results were also provided in writing at the conclusion of the
visit. If applicable, a reminder letter will be sent to the patient
regarding the next appointment.

BI-RADS CATEGORY  4: Suspicious.

## 2020-08-11 IMAGING — MG MM CLIP PLACEMENT
3 series · 3 of 3 positions shown · non-contrast
Comparison: Previous exam(s).

CLINICAL DATA: Confirmation of clip placement ultrasound-guided
core needle biopsy of a 3 mm mass in the UPPER INNER QUADRANT of the
RIGHT breast at POSTERIOR depth.

EXAM:
DIAGNOSTIC RIGHT MAMMOGRAM POST ULTRASOUND BIOPSY

[R CC]
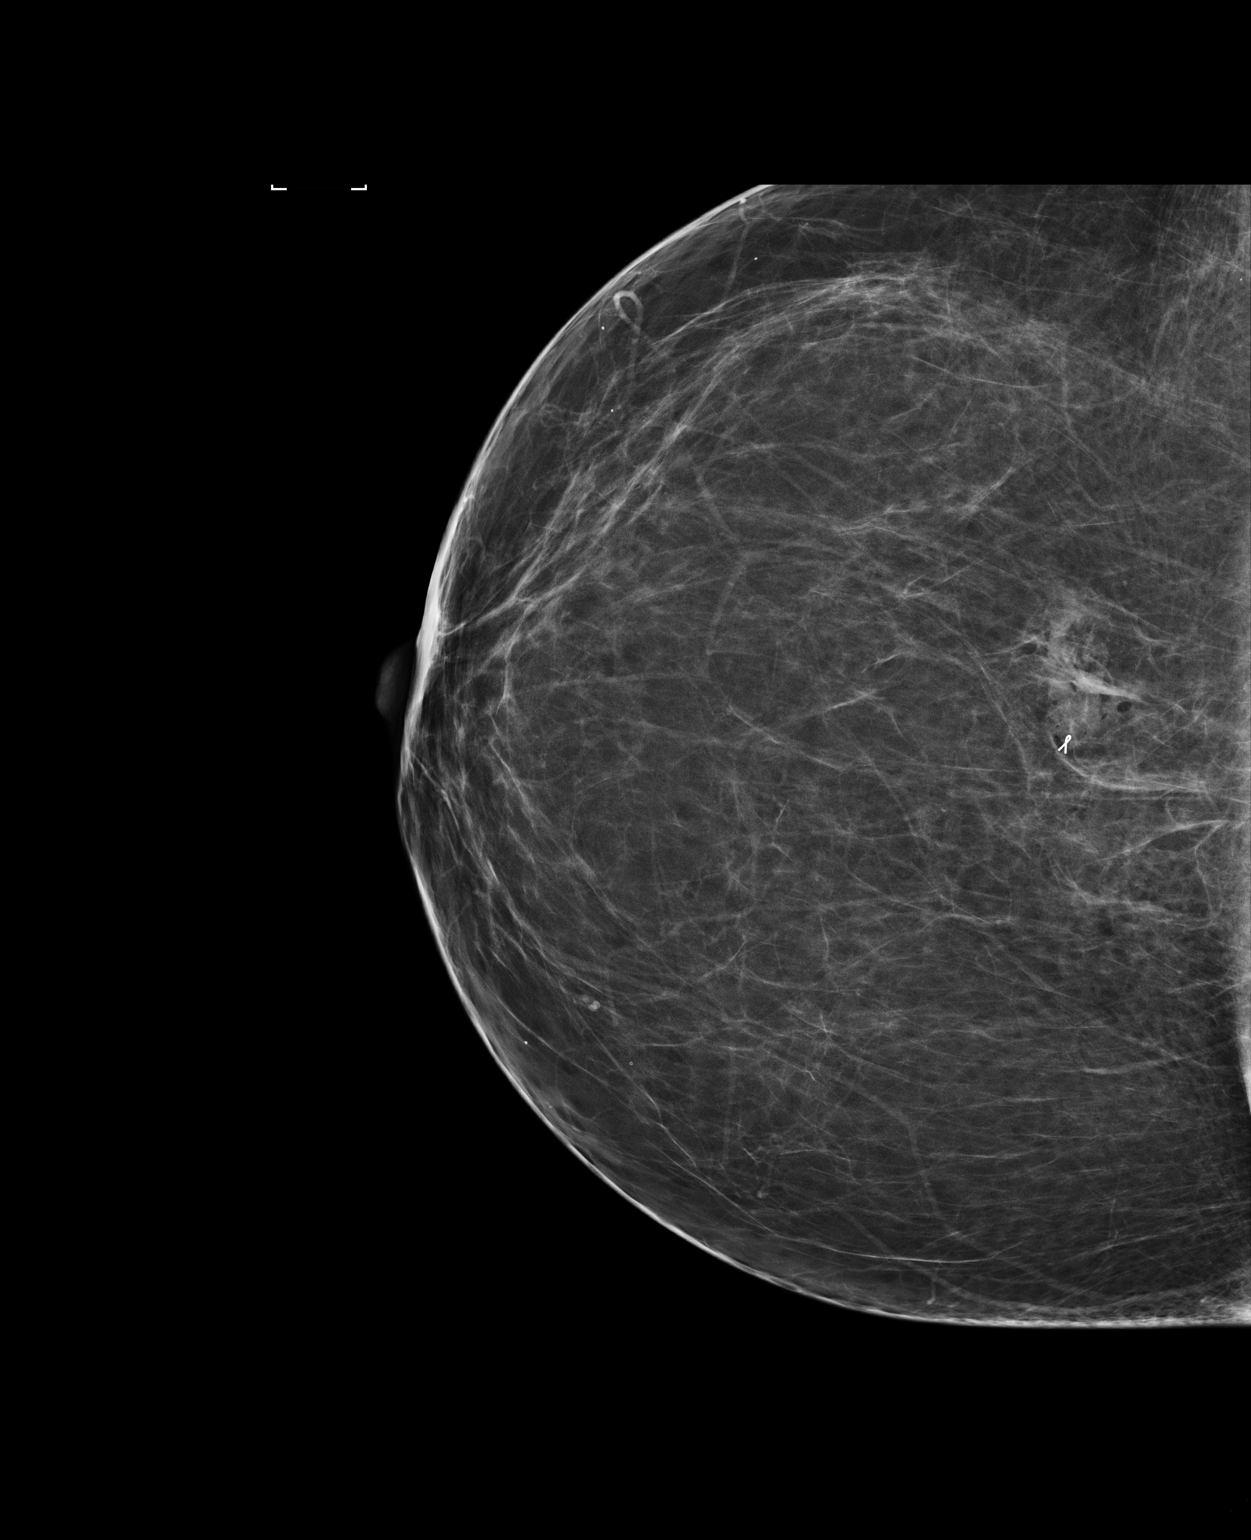

[R ML]
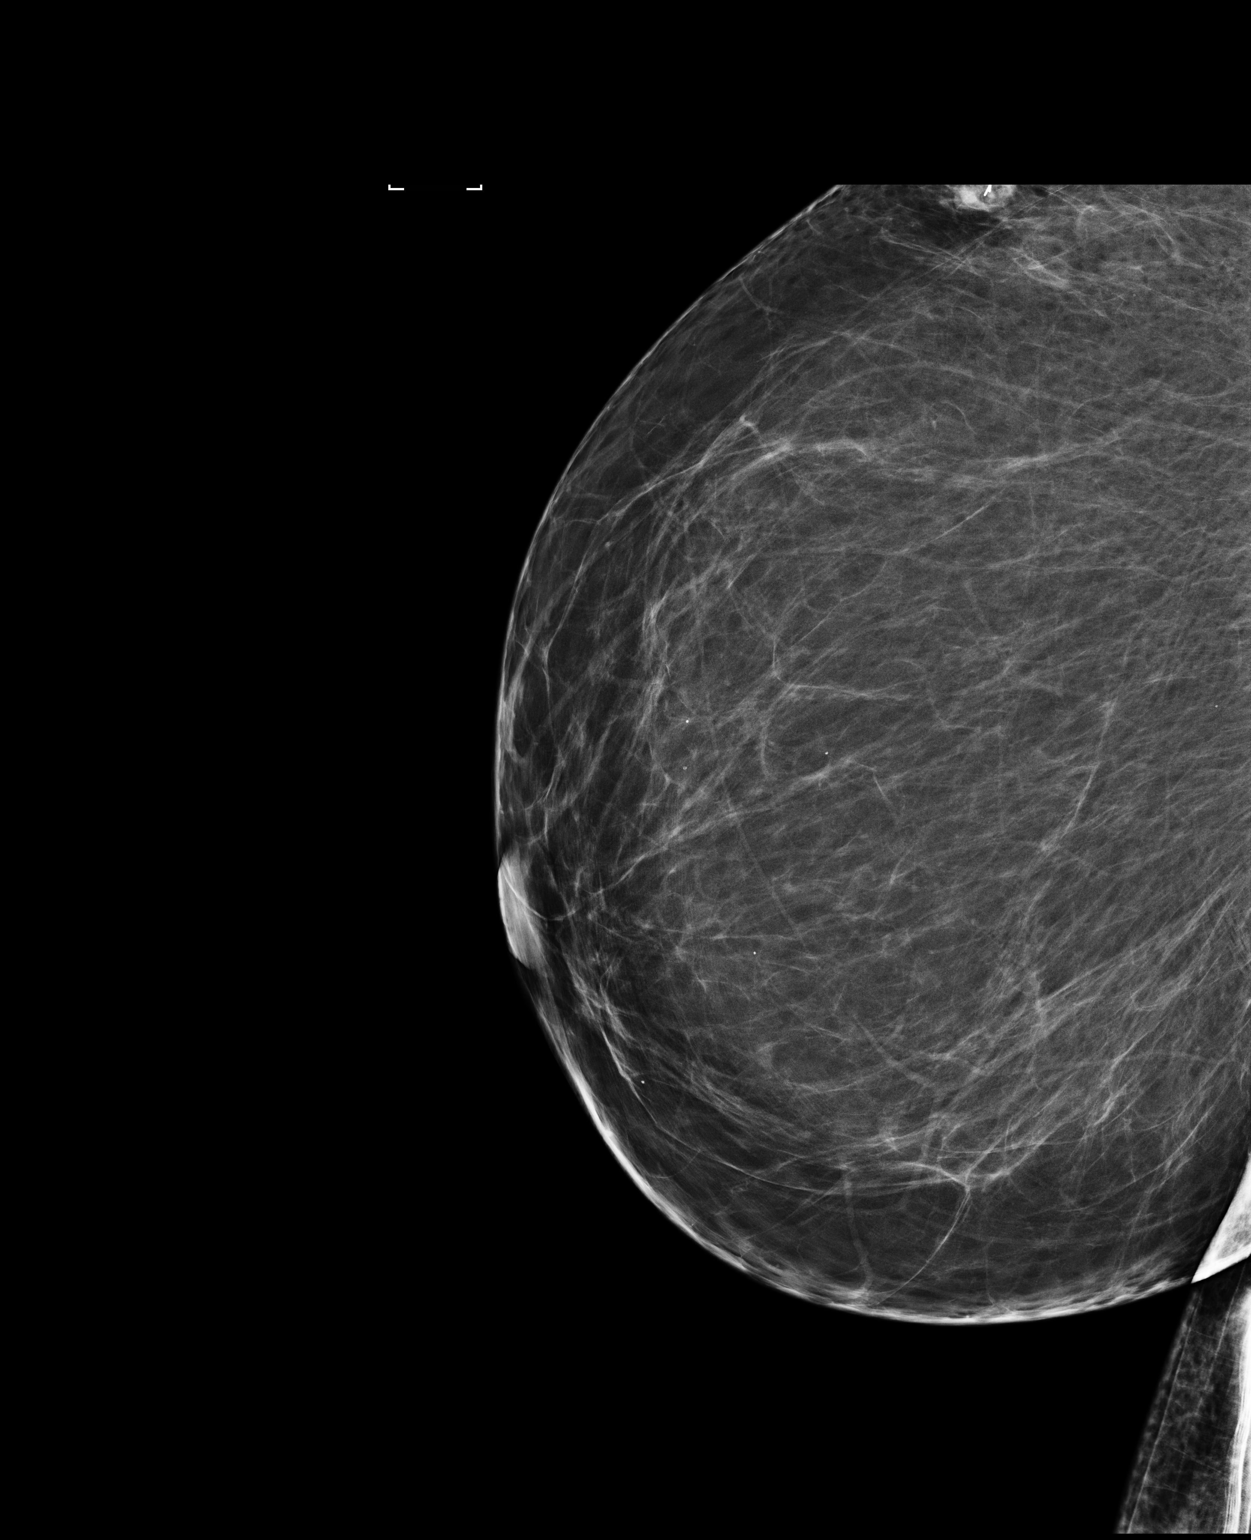

[R MLO]
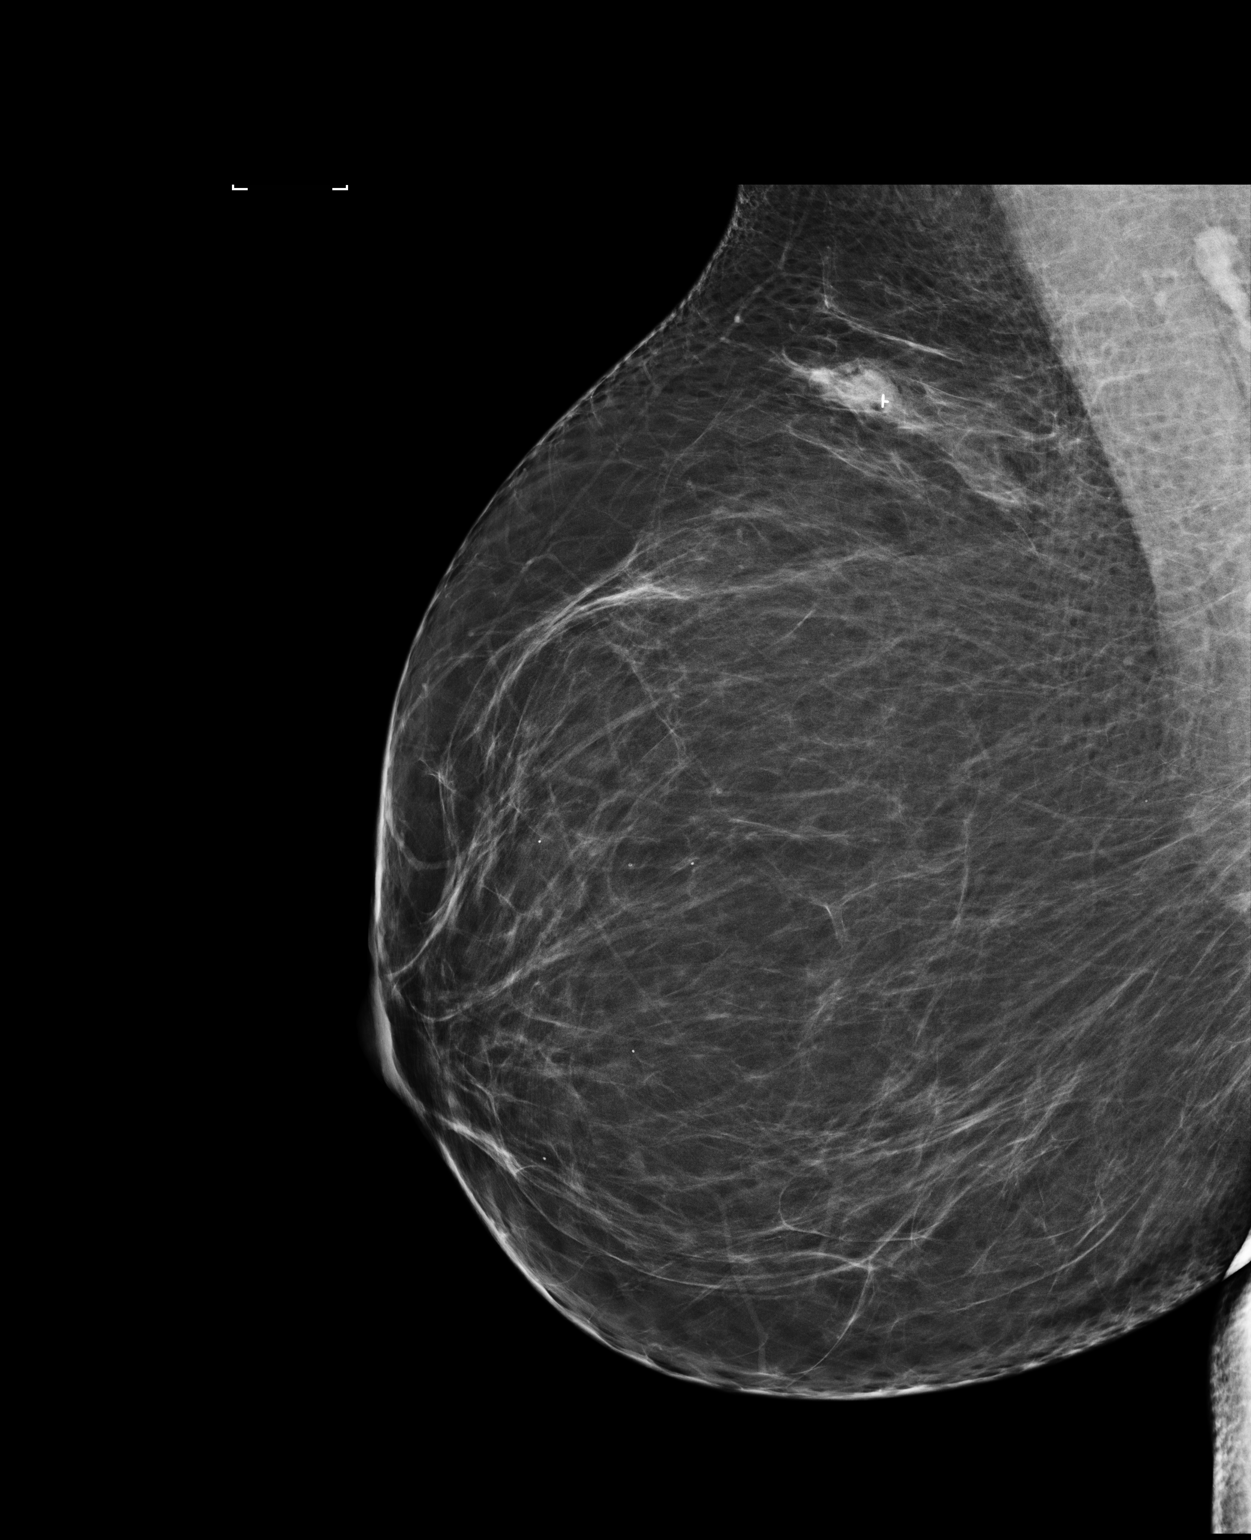

[3 of 3 positions shown; findings below may reference images not displayed]

FINDINGS: Mammographic images were obtained following ultrasound guided biopsy
of a 3 mm mass in the UPPER INNER QUADRANT of the RIGHT breast. The
ribbon shaped tissue marker clip is appropriately positioned
immediately adjacent to the biopsied mass in the UPPER INNER
QUADRANT at POSTERIOR. Expected post biopsy changes are present
without evidence hematoma.
IMPRESSION: Appropriate positioning of the ribbon shaped tissue marker clip
immediately adjacent to biopsied mass in the UPPER INNER QUADRANT of
the RIGHT breast at POSTERIOR depth.

Final Assessment: Post Procedure Mammograms for Marker Placement
